# Patient Record
Sex: Female | Born: 1975 | Race: White | Hispanic: Yes | Marital: Married | Smoking: Never smoker
Health system: Southern US, Community
[De-identification: ages and names within clinical notes are randomized; demographics above are authoritative.]

## PROBLEM LIST (undated history)

## (undated) DIAGNOSIS — I10 Essential (primary) hypertension: Secondary | ICD-10-CM

## (undated) DIAGNOSIS — R87629 Unspecified abnormal cytological findings in specimens from vagina: Secondary | ICD-10-CM

## (undated) DIAGNOSIS — E079 Disorder of thyroid, unspecified: Secondary | ICD-10-CM

## (undated) HISTORY — DX: Unspecified abnormal cytological findings in specimens from vagina: R87.629

## (undated) HISTORY — PX: HERNIA REPAIR: SHX51

## (undated) HISTORY — DX: Disorder of thyroid, unspecified: E07.9

---

## 1999-12-03 ENCOUNTER — Inpatient Hospital Stay (HOSPITAL_COMMUNITY): Admission: EM | Admit: 1999-12-03 | Discharge: 1999-12-03 | Payer: Self-pay | Admitting: *Deleted

## 2001-12-21 ENCOUNTER — Ambulatory Visit (HOSPITAL_COMMUNITY): Admission: RE | Admit: 2001-12-21 | Discharge: 2001-12-21 | Payer: Self-pay | Admitting: *Deleted

## 2002-04-26 ENCOUNTER — Ambulatory Visit (HOSPITAL_COMMUNITY): Admission: RE | Admit: 2002-04-26 | Discharge: 2002-04-26 | Payer: Self-pay | Admitting: *Deleted

## 2002-04-26 ENCOUNTER — Encounter: Payer: Self-pay | Admitting: *Deleted

## 2002-05-07 ENCOUNTER — Inpatient Hospital Stay (HOSPITAL_COMMUNITY): Admission: AD | Admit: 2002-05-07 | Discharge: 2002-05-07 | Payer: Self-pay | Admitting: *Deleted

## 2002-05-21 ENCOUNTER — Inpatient Hospital Stay (HOSPITAL_COMMUNITY): Admission: AD | Admit: 2002-05-21 | Discharge: 2002-05-21 | Payer: Self-pay | Admitting: *Deleted

## 2002-05-22 ENCOUNTER — Inpatient Hospital Stay (HOSPITAL_COMMUNITY): Admission: AD | Admit: 2002-05-22 | Discharge: 2002-05-22 | Payer: Self-pay | Admitting: *Deleted

## 2002-05-23 ENCOUNTER — Inpatient Hospital Stay (HOSPITAL_COMMUNITY): Admission: AD | Admit: 2002-05-23 | Discharge: 2002-05-26 | Payer: Self-pay | Admitting: *Deleted

## 2002-05-23 ENCOUNTER — Inpatient Hospital Stay (HOSPITAL_COMMUNITY): Admission: AD | Admit: 2002-05-23 | Discharge: 2002-05-23 | Payer: Self-pay | Admitting: *Deleted

## 2003-06-13 ENCOUNTER — Ambulatory Visit (HOSPITAL_COMMUNITY): Admission: RE | Admit: 2003-06-13 | Discharge: 2003-06-13 | Payer: Self-pay | Admitting: Family Medicine

## 2003-06-26 ENCOUNTER — Other Ambulatory Visit: Admission: RE | Admit: 2003-06-26 | Discharge: 2003-06-26 | Payer: Self-pay | Admitting: Obstetrics and Gynecology

## 2004-01-02 ENCOUNTER — Ambulatory Visit (HOSPITAL_COMMUNITY): Admission: RE | Admit: 2004-01-02 | Discharge: 2004-01-02 | Payer: Self-pay | Admitting: *Deleted

## 2004-04-01 ENCOUNTER — Inpatient Hospital Stay (HOSPITAL_COMMUNITY): Admission: AD | Admit: 2004-04-01 | Discharge: 2004-04-02 | Payer: Self-pay | Admitting: *Deleted

## 2004-04-01 ENCOUNTER — Ambulatory Visit: Payer: Self-pay | Admitting: Obstetrics & Gynecology

## 2005-06-17 ENCOUNTER — Other Ambulatory Visit: Admission: RE | Admit: 2005-06-17 | Discharge: 2005-06-17 | Payer: Self-pay | Admitting: Gynecology

## 2016-06-26 ENCOUNTER — Observation Stay (HOSPITAL_COMMUNITY)
Admission: EM | Admit: 2016-06-26 | Discharge: 2016-06-27 | Disposition: A | Discharge status: Home / Self Care | Payer: Self-pay | Attending: Emergency Medicine | Admitting: Emergency Medicine

## 2016-06-26 ENCOUNTER — Emergency Department (HOSPITAL_COMMUNITY): Payer: Self-pay

## 2016-06-26 ENCOUNTER — Encounter (HOSPITAL_COMMUNITY): Payer: Self-pay

## 2016-06-26 DIAGNOSIS — R079 Chest pain, unspecified: Secondary | ICD-10-CM | POA: Insufficient documentation

## 2016-06-26 DIAGNOSIS — R739 Hyperglycemia, unspecified: Secondary | ICD-10-CM | POA: Insufficient documentation

## 2016-06-26 DIAGNOSIS — I160 Hypertensive urgency: Principal | ICD-10-CM | POA: Insufficient documentation

## 2016-06-26 DIAGNOSIS — I10 Essential (primary) hypertension: Secondary | ICD-10-CM | POA: Insufficient documentation

## 2016-06-26 HISTORY — DX: Essential (primary) hypertension: I10

## 2016-06-26 MED ORDER — SODIUM CHLORIDE 0.9 % IV SOLN
Freq: Once | INTRAVENOUS | Status: AC
  Administered 2016-06-27: 01:00:00 via INTRAVENOUS

## 2016-06-26 MED ORDER — AMLODIPINE BESYLATE 5 MG PO TABS
5.0000 mg | ORAL_TABLET | Freq: Once | ORAL | Status: AC
  Administered 2016-06-27: 5 mg via ORAL
  Filled 2016-06-26: qty 1

## 2016-06-26 NOTE — ED Provider Notes (Addendum)
[facility]-EMER[facility] DEPT Provider Note   CSN: [id number] Arrival date & time: 06/26/16  2224     History   Chief Complaint Chief Complaint  Patient presents with  . Hypertension    HPI Ty HiltsMonica Mathey is a 40 y.o. female.  As a 40 year old female with a history of hypertension.  He has not taken medication in approximately 3 years.  She currently has an IUD in place.  So has no regular menstrual cycles. Tonight, about 9:30 she started developing a headache, some chest discomfort, chest tightness radiating to her left arm.  Denies shortness of breath, nausea or diaphoresis.  Her husband took her blood pressure.  It was over 200.  He ever to Tylenol for her headache and brought her to the emergency department for evaluation.      Past Medical History:  Diagnosis Date  . Hypertension     Patient Active Problem List   Diagnosis Date Noted  . Chest pain 06/27/2016    History reviewed. No pertinent surgical history.  OB History    No data available       Home Medications    Prior to Admission medications   Medication Sig Start Date End Date Taking? Authorizing Provider  acetaminophen (TYLENOL) 500 MG tablet Take 500-1,000 mg by mouth every 6 (six) hours as needed for headache.   Yes Historical Provider, MD  Artificial Tear Ointment (DRY EYES OP) Place 1-2 drops into both eyes 3 (three) times daily as needed (for dry eyes).   Yes Historical Provider, MD  levonorgestrel (MIRENA) 20 MCG/24HR IUD 1 each by Intrauterine route once.   Yes Historical Provider, MD  naproxen sodium (ANAPROX) 220 MG tablet Take 220-440 mg by mouth 2 (two) times daily as needed (for pain).   Yes Historical Provider, MD    Family History History reviewed. No pertinent family history.  Social History Social History  Substance Use Topics  . Smoking status: Never Smoker  . Smokeless tobacco: Never Used  . Alcohol use No     Allergies   Patient has no known allergies.   Review of  Systems Review of Systems  Constitutional: Negative for chills and fever.  HENT: Negative for congestion.   Eyes: Negative for visual disturbance.  Respiratory: Negative for shortness of breath.   Cardiovascular: Positive for chest pain.  Gastrointestinal: Negative for nausea.  Genitourinary: Negative for dysuria.  Neurological: Positive for headaches. Negative for dizziness.  All other systems reviewed and are negative.    Physical Exam Updated Vital Signs BP 138/79   Pulse 77   Temp 99.3 F (37.4 C) (Oral)   Resp 17   Wt 72.1 kg   SpO2 99%   Physical Exam  Constitutional: She is oriented to person, place, and time. She appears well-developed and well-nourished.  Eyes: EOM are normal. Pupils are equal, round, and reactive to light.  Neck: Normal range of motion.  Cardiovascular: Normal rate and normal heart sounds.   Pulmonary/Chest: Effort normal and breath sounds normal.  Abdominal: Soft. Bowel sounds are normal.  Musculoskeletal: Normal range of motion. She exhibits no edema.  Neurological: She is alert and oriented to person, place, and time.  Skin: Skin is warm and dry.  Psychiatric: She has a normal mood and affect.  Nursing note and vitals reviewed.    ED Treatments / Results  Labs (all labs ordered are listed, but only abnormal results are displayed) Labs Reviewed  CBC WITH DIFFERENTIAL/PLATELET - Abnormal; Notable for the following:  Result Value   WBC 10.8 (*)    Neutro Abs 9.4 (*)    All other components within normal limits  URINALYSIS, ROUTINE W REFLEX MICROSCOPIC - Abnormal; Notable for the following:    Hgb urine dipstick SMALL (*)    Bacteria, UA RARE (*)    All other components within normal limits  I-STAT CHEM 8, ED - Abnormal; Notable for the following:    Glucose, Bld 169 (*)    Calcium, Ion 1.09 (*)    All other components within normal limits  PREGNANCY, URINE  I-STAT TROPOININ, ED    EKG  EKG  Interpretation  Date/Time:  Monday June 27 2016 00:22:22 EST Ventricular Rate:  74 PR Interval:    QRS Duration: 99 QT Interval:  394 QTC Calculation: 438 R Axis:   -168 Text Interpretation:  Sinus rhythm S1,S2,S3 pattern No old tracing to compare Confirmed by WARD,  DO, KRISTEN ([id number]) on 06/27/2016 1:12:05 AM       Radiology Ct Head Wo Contrast  Result Date: 06/27/2016 CLINICAL DATA:  40 year old female with hypertension and headache. EXAM: CT HEAD WITHOUT CONTRAST TECHNIQUE: Contiguous axial images were obtained from the base of the skull through the vertex without intravenous contrast. COMPARISON:  None. FINDINGS: Brain: No evidence of acute infarction, hemorrhage, hydrocephalus, extra-axial collection or mass lesion/mass effect. Vascular: No hyperdense vessel or unexpected calcification. Skull: Normal. Negative for fracture or focal lesion. Sinuses/Orbits: No acute finding. Other: None. IMPRESSION: No acute intracranial pathology. Electronically Signed   By: Elgie CollardArash  Radparvar M.D.   On: 06/27/2016 01:04    Procedures Procedures (including critical care time)  Medications Ordered in ED Medications  0.9 %  sodium chloride infusion ( Intravenous Stopped 06/27/16 0119)  amLODipine (NORVASC) tablet 5 mg (5 mg Oral Given 06/27/16 0032)     Initial Impression / Assessment and Plan / ED Course  I have reviewed the triage vital signs and the nursing notes.  Pertinent labs & imaging results that were available during my care of the patient were reviewed by me and considered in my medical decision making (see chart for details).  Clinical Course      Obtain basic screening labs, plus troponin due to patient's chest pain.  EKG, head CT.  She'll be given Norvasc and reevaluated Actions.  Blood pressure is normalizing with 5 mg, Norvasc, but of concern is her chest discomfort, elevated glucose level.  I feel this patient would benefit from an overnight observation to rule out  cardiac pathology.  A repeat fasting glucose in the morning. She refuses admission, stating she will follow-up outpatient.  I discussed this at length with her husband and still insisting on leaving AMA Final Clinical Impressions(s) / ED Diagnoses   Final diagnoses:  Hypertensive urgency  Chest pain, unspecified type  Hyperglycemia    New Prescriptions New Prescriptions   No medications on file     Earley FavorGail Beula Joyner, NP 06/26/16 2342    Vanetta MuldersScott Zackowski, MD 06/26/16 2354    Earley FavorGail Alayiah Fontes, NP 06/27/16 0133    Earley FavorGail Jaaziah Schulke, NP 06/27/16 0150    Earley FavorGail Chasta Deshpande, NP 06/27/16 0225    Earley FavorGail Jahmarion Popoff, NP 06/27/16 [id number]    Vanetta MuldersScott Zackowski, MD 06/30/16 1448

## 2016-06-26 NOTE — ED Triage Notes (Signed)
Pt complaining hypertension and headache. Pt states hx of htn, not currently on medication. Pt states home BP = 210/125, BP at triage = 181/97. Pt denies any lightheadedness or dizziness.

## 2016-06-27 DIAGNOSIS — R079 Chest pain, unspecified: Secondary | ICD-10-CM | POA: Diagnosis present

## 2016-06-27 LAB — I-STAT CHEM 8, ED
BUN: 15 mg/dL (ref 6–20)
CALCIUM ION: 1.09 mmol/L — AB (ref 1.15–1.40)
Chloride: 102 mmol/L (ref 101–111)
Creatinine, Ser: 0.5 mg/dL (ref 0.44–1.00)
Glucose, Bld: 169 mg/dL — ABNORMAL HIGH (ref 65–99)
HEMATOCRIT: 39 % (ref 36.0–46.0)
Hemoglobin: 13.3 g/dL (ref 12.0–15.0)
Potassium: 3.7 mmol/L (ref 3.5–5.1)
SODIUM: 139 mmol/L (ref 135–145)
TCO2: 22 mmol/L (ref 0–100)

## 2016-06-27 LAB — URINALYSIS, ROUTINE W REFLEX MICROSCOPIC
Bilirubin Urine: NEGATIVE
GLUCOSE, UA: NEGATIVE mg/dL
KETONES UR: NEGATIVE mg/dL
Leukocytes, UA: NEGATIVE
Nitrite: NEGATIVE
Protein, ur: NEGATIVE mg/dL
SPECIFIC GRAVITY, URINE: 1.005 (ref 1.005–1.030)
SQUAMOUS EPITHELIAL / LPF: NONE SEEN
pH: 6 (ref 5.0–8.0)

## 2016-06-27 LAB — CBC WITH DIFFERENTIAL/PLATELET
BASOS PCT: 0 %
Basophils Absolute: 0 10*3/uL (ref 0.0–0.1)
EOS ABS: 0.1 10*3/uL (ref 0.0–0.7)
EOS PCT: 1 %
HCT: 39 % (ref 36.0–46.0)
HEMOGLOBIN: 13 g/dL (ref 12.0–15.0)
Lymphocytes Relative: 8 %
Lymphs Abs: 0.9 10*3/uL (ref 0.7–4.0)
MCH: 27.5 pg (ref 26.0–34.0)
MCHC: 33.3 g/dL (ref 30.0–36.0)
MCV: 82.5 fL (ref 78.0–100.0)
Monocytes Absolute: 0.5 10*3/uL (ref 0.1–1.0)
Monocytes Relative: 5 %
NEUTROS PCT: 86 %
Neutro Abs: 9.4 10*3/uL — ABNORMAL HIGH (ref 1.7–7.7)
PLATELETS: 257 10*3/uL (ref 150–400)
RBC: 4.73 MIL/uL (ref 3.87–5.11)
RDW: 13.2 % (ref 11.5–15.5)
WBC: 10.8 10*3/uL — AB (ref 4.0–10.5)

## 2016-06-27 LAB — PREGNANCY, URINE: Preg Test, Ur: NEGATIVE

## 2016-06-27 LAB — I-STAT TROPONIN, ED: TROPONIN I, POC: 0.01 ng/mL (ref 0.00–0.08)

## 2016-06-27 MED ORDER — AMLODIPINE BESYLATE 5 MG PO TABS: 5.0000 mg | ORAL_TABLET | Freq: Every day | ORAL | 0 refills | Status: AC

## 2016-06-27 NOTE — Discharge Instructions (Signed)
I wished she would've stayed in the hospital for further evaluation.  Please return anytime you develop chest pain, shortness of breath, nausea, sweating. I also recommend that you establish with a primary care physician as soon as possible to have your sugar rechecked as today was quite elevated You have been given a prescription for Norvasc, which is the medication.  We given in the emergency department for your blood pressure.  Please take this on a daily basis

## 2016-06-27 NOTE — ED Notes (Signed)
Nurse starting and getting labs

## 2017-05-03 ENCOUNTER — Other Ambulatory Visit: Payer: Self-pay | Admitting: Obstetrics & Gynecology

## 2017-05-03 DIAGNOSIS — Z1231 Encounter for screening mammogram for malignant neoplasm of breast: Secondary | ICD-10-CM

## 2017-05-23 ENCOUNTER — Ambulatory Visit
Admission: RE | Admit: 2017-05-23 | Discharge: 2017-05-23 | Disposition: A | Discharge status: Home / Self Care | Payer: No Typology Code available for payment source | Source: Ambulatory Visit | Attending: Obstetrics & Gynecology | Admitting: Obstetrics & Gynecology

## 2017-05-23 DIAGNOSIS — Z1231 Encounter for screening mammogram for malignant neoplasm of breast: Secondary | ICD-10-CM

## 2019-12-20 ENCOUNTER — Other Ambulatory Visit: Payer: Self-pay | Admitting: *Deleted

## 2019-12-20 DIAGNOSIS — Z1231 Encounter for screening mammogram for malignant neoplasm of breast: Secondary | ICD-10-CM

## 2020-01-14 ENCOUNTER — Ambulatory Visit: Payer: No Typology Code available for payment source

## 2020-01-16 ENCOUNTER — Other Ambulatory Visit: Payer: Self-pay

## 2020-01-16 ENCOUNTER — Ambulatory Visit: Payer: Self-pay | Admitting: *Deleted

## 2020-01-16 ENCOUNTER — Ambulatory Visit
Admission: RE | Admit: 2020-01-16 | Discharge: 2020-01-16 | Disposition: A | Discharge status: Home / Self Care | Payer: No Typology Code available for payment source | Source: Ambulatory Visit | Attending: Obstetrics and Gynecology | Admitting: Obstetrics and Gynecology

## 2020-01-16 VITALS — BP 143/84 | Temp 98.4°F | Wt 153.5 lb

## 2020-01-16 DIAGNOSIS — Z1239 Encounter for other screening for malignant neoplasm of breast: Secondary | ICD-10-CM

## 2020-01-16 DIAGNOSIS — R87612 Low grade squamous intraepithelial lesion on cytologic smear of cervix (LGSIL): Secondary | ICD-10-CM

## 2020-01-16 DIAGNOSIS — Z1231 Encounter for screening mammogram for malignant neoplasm of breast: Secondary | ICD-10-CM

## 2020-01-16 NOTE — Progress Notes (Addendum)
[name] is a 44 y.o. female who presents to [facility] clinic today with complaint of lump on left nipple x 1.5 months. Patient initially referred to BCCCP by the [facility] Department due to having an abnormal Pap smear on 12/04/2019 that a colposcopy is recommended for follow-up.   Pap Smear: Pap not smear completed today. Last Pap smear was 12/04/2019 at the [facility] Department clinic and was LSIL with positive HPV. Per patient has no history of an abnormal Pap smear prior to her most recent Pap smear. Last Pap smear result is available in Epic.   Physical exam: Breasts Breasts symmetrical. No skin abnormalities bilateral breasts. Bilateral nipple retraction that is greater within the left nipple that per patient is normal for her. No nipple discharge bilateral breasts. No lymphadenopathy. No lumps palpated bilateral breasts. No lump palpated in patients area of concern. Area on left nipple appears slightly swollen with no redness or skin changes. Patient denies any injuries to her breast. No complaints of pain or tenderness on exam.       Pelvic/Bimanual Pap is not indicated today per BCCCP guidelines.    Smoking History: Patient has never smoked.   Patient Navigation: Patient education provided. Access to services provided for patient through [place] program. Spanish interpreter Maretta Los from [facility] provided.   Breast and Cervical Cancer Risk Assessment: Patient has family history of her father having breast cancer. Patient has no known genetic mutations or history of radiation treatment to the chest before age 53. Patient does not have history of cervical dysplasia, immunocompromised, or DES exposure in-utero.  Risk Assessment    Risk Scores      01/16/2020   Last edited by: Narda Rutherford, LPN   5-year risk: 1 %   Lifetime risk: 13.1 %          A: BCCCP exam without pap smear  P: Referred patient to the [facility] Center of [facility] for a  screening mammogram on the mobile unit. Appointment scheduled Thursday, January 16, 2020 at 0900.  Referred patient to the [facility] for a colposcopy to follow-up for her abnormal Pap smear. Appointment scheduled for Friday, January 24, 2020 at 0835.  Priscille Heidelberg, RN 01/16/2020 8:40 AM

## 2020-01-16 NOTE — Patient Instructions (Signed)
Explained breast self awareness with [name]. Patient did not need a Pap smear today due to last Pap smear was 12/04/2019. Explained the colposcopy the recommended follow-up for her abnormal Pap smear. Referred patient to the [facility] for [facility] for a colposcopy to follow-up for her abnormal Pap smear. Appointment scheduled for Friday, January 24, 2020 at 0835. Referred patient to the [facility] Center of [facility] for a screening mammogram on the mobile unit. Appointment scheduled Thursday, January 16, 2020 at 0900. Patient aware of appointments and will be there. Let patient know the Breast Center will follow up with her within the next couple weeks with results of her mammogram by letter or phone. Dawn Nguyen verbalized understanding.  Avianna Moynahan, [name], RN 8:40 AM

## 2020-01-24 ENCOUNTER — Encounter: Payer: Self-pay | Admitting: Obstetrics and Gynecology

## 2020-01-24 ENCOUNTER — Ambulatory Visit (INDEPENDENT_AMBULATORY_CARE_PROVIDER_SITE_OTHER): Payer: Self-pay | Admitting: Obstetrics and Gynecology

## 2020-01-24 ENCOUNTER — Other Ambulatory Visit: Payer: Self-pay

## 2020-01-24 ENCOUNTER — Other Ambulatory Visit (HOSPITAL_COMMUNITY)
Admission: RE | Admit: 2020-01-24 | Discharge: 2020-01-24 | Disposition: A | Discharge status: Home / Self Care | Payer: No Typology Code available for payment source | Source: Ambulatory Visit | Attending: Obstetrics and Gynecology | Admitting: Obstetrics and Gynecology

## 2020-01-24 VITALS — BP 146/98 | HR 90 | Ht 62.0 in | Wt 152.9 lb

## 2020-01-24 DIAGNOSIS — N879 Dysplasia of cervix uteri, unspecified: Secondary | ICD-10-CM | POA: Insufficient documentation

## 2020-01-24 DIAGNOSIS — N871 Moderate cervical dysplasia: Secondary | ICD-10-CM

## 2020-01-24 DIAGNOSIS — Z3202 Encounter for pregnancy test, result negative: Secondary | ICD-10-CM

## 2020-01-24 HISTORY — PX: COLPOSCOPY W/ BIOPSY / CURETTAGE: SUR283

## 2020-01-24 NOTE — Procedures (Signed)
Colposcopy Procedure Note  Pre-operative Diagnosis: 11/2019 pap: LSIL, HPV+. Patient states she's never had an abnormal pap before. Last pap unknown  Post-operative Diagnosis: CIN 1  Procedure Details  UPT negative. patient also denies any tobacco use or 2nd hand smoke exposure.   The risks (including infection, bleeding, pain) and benefits of the procedure were explained to the patient and written informed consent was obtained.  The patient was placed in the dorsal lithotomy position. A Graves was speculum inserted in the vagina, and the cervix was visualized.  AA staining done Lugol's with green filter.  Biopsy from 12 and 6 o'clock and then single toothed tenaculum applied and ECC in all four quadrants done. No bleeding after procedure with silver nitrate  Findings: mild AWE changes at 12 and 6 o'clock  Adequate: no  Specimens: 12 and 6 o'clock cervical (sent together), endocervical curettage  Condition: Stable  Complications: None  Plan: The patient was advised to call for any fever or for prolonged or severe pain or bleeding. She was advised to use OTC analgesics as needed for mild to moderate pain. She was advised to do pelvic rest for one week.    Cornelia Copa MD Attending Center for [employer] [facility])

## 2020-01-27 LAB — POCT PREGNANCY, URINE: Preg Test, Ur: NEGATIVE

## 2020-01-28 LAB — SURGICAL PATHOLOGY

## 2020-02-03 ENCOUNTER — Telehealth: Payer: Self-pay | Admitting: Lactation Services

## 2020-02-03 NOTE — Telephone Encounter (Signed)
Called patient with assistance of [employer] [place], [place] # [id number].   Attempted to call patient twice. She did not answer. LM for patient to call the office for results. Call back number given.

## 2020-02-03 NOTE — Telephone Encounter (Signed)
-----   Message from Mission Bing, MD sent at 01/30/2020  9:07 AM EDT ----- [facility] clinic, can you let her know that her colpo had some slightly abnormal cells and to just repeat her pap smear and HPV testing in one year? Thanks!

## 2020-02-17 ENCOUNTER — Telehealth: Payer: Self-pay

## 2020-02-17 NOTE — Telephone Encounter (Signed)
Spoke with patient via interpreter Natale Lay. Informed patient that her colpo showed mild dysplasia with some slightly abnormal cells. Based on this result Dr. Vergie Living recommended that she have a repeat pap smear and co-testing in 1 year. Patient voiced understanding.

## 2020-12-22 ENCOUNTER — Other Ambulatory Visit: Payer: Self-pay | Admitting: Obstetrics and Gynecology

## 2020-12-22 ENCOUNTER — Other Ambulatory Visit: Payer: Self-pay | Admitting: *Deleted

## 2020-12-22 DIAGNOSIS — Z1231 Encounter for screening mammogram for malignant neoplasm of breast: Secondary | ICD-10-CM

## 2021-02-04 ENCOUNTER — Other Ambulatory Visit: Payer: Self-pay

## 2021-02-04 ENCOUNTER — Ambulatory Visit: Payer: Self-pay | Admitting: *Deleted

## 2021-02-04 ENCOUNTER — Ambulatory Visit
Admission: RE | Admit: 2021-02-04 | Discharge: 2021-02-04 | Disposition: A | Discharge status: Home / Self Care | Payer: No Typology Code available for payment source | Source: Ambulatory Visit | Attending: Obstetrics and Gynecology | Admitting: Obstetrics and Gynecology

## 2021-02-04 VITALS — BP 100/70 | Wt 145.3 lb

## 2021-02-04 DIAGNOSIS — Z1239 Encounter for other screening for malignant neoplasm of breast: Secondary | ICD-10-CM

## 2021-02-04 DIAGNOSIS — Z1231 Encounter for screening mammogram for malignant neoplasm of breast: Secondary | ICD-10-CM

## 2021-02-04 NOTE — Patient Instructions (Signed)
Explained breast self awareness with [name]. Patient did not need a Pap smear today due to last Pap smear was 01/28/2021 per patient. Patient has not received results for Pap smear. Let patient know that follow-up for Pap smear will be based on the result of today's Pap smear. Referred patient to the [facility] Center of [facility] for a screening mammogram on mobile unit. Appointment scheduled Thursday, February 04, 2021 at 1500. Patient escorted to the mobile unit following BCCCP appointment for her screening mammogram. Let patient know the Breast Center will follow up with her within the next couple weeks with results of her mammogram by letter or phone. Dawn Nguyen verbalized understanding.  [name], [name], RN 2:57 PM

## 2021-02-04 NOTE — Progress Notes (Signed)
Dawn Nguyen is a 45 y.o. female who presents to [facility] clinic today with no complaints.    Pap Smear: Pap smear not completed today. Last Pap smear was 01/28/2021 at the [facility] clinic and patient has not received her results. Patient has history of an abnormal Pap smear 12/04/2019 that was LSIL with positive HPV that a colposcopy was completed for follow-up that showed CIN-I. Last Pap smear result is available in Epic.   Physical exam: Breasts Breasts symmetrical. No skin abnormalities bilateral breasts. No nipple retraction bilateral breasts. No nipple discharge bilateral breasts. No lymphadenopathy. No lumps palpated bilateral breasts. No complaints of pain or tenderness on exam.  MS DIGITAL SCREENING BILATERAL  Result Date: 05/23/2017 CLINICAL DATA:  Screening. EXAM: DIGITAL SCREENING BILATERAL MAMMOGRAM WITH CAD COMPARISON:  None. ACR Breast Density Category c: The breast tissue is heterogeneously dense, which may obscure small masses FINDINGS: There are no findings suspicious for malignancy. Images were processed with CAD. IMPRESSION: No mammographic evidence of malignancy. A result letter of this screening mammogram will be mailed directly to the patient. RECOMMENDATION: Screening mammogram in one year. (Code:SM-B-01Y) BI-RADS CATEGORY  1: Negative. Electronically Signed   By: Harmon Pier M.D.   On: 05/23/2017 16:05   MS DIGITAL SCREENING TOMO BILATERAL  Result Date: 01/21/2020 CLINICAL DATA:  Screening. EXAM: DIGITAL SCREENING BILATERAL MAMMOGRAM WITH TOMO AND CAD COMPARISON:  Previous exam(s). ACR Breast Density Category c: The breast tissue is heterogeneously dense, which may obscure small masses. FINDINGS: There are no findings suspicious for malignancy. Images were processed with CAD. IMPRESSION: No mammographic evidence of malignancy. A result letter of this screening mammogram will be mailed directly to the patient. RECOMMENDATION: Screening mammogram in one  year. (Code:SM-B-01Y) BI-RADS CATEGORY  1: Negative. Electronically Signed   By: Bary Richard M.D.   On: 01/21/2020 10:03        Pelvic/Bimanual Pap is not indicated today per BCCCP guidelines.   Smoking History: Patient has never smoked.   Patient Navigation: Patient education provided. Access to services provided for patient through [place] program. Spanish interpreter Natale Lay from [facility] provided.   Colorectal Cancer Screening: Per patient has never had colonoscopy completed. No complaints today.    Breast and Cervical Cancer Risk Assessment: Patient has family history of her father having breast cancer. Patient has no known genetic mutations or history of radiation treatment to the chest before age 82. Patient has history of cervical dysplasia. Patient has no history of being immunocompromised or DES exposure in-utero.  Risk Assessment     Risk Scores       02/04/2021 01/16/2020   Last edited by: Narda Rutherford, LPN McG[place], Sherie Demetrius Charity, LPN   5-year risk: 1.1 % 1 %   Lifetime risk: 13 % 13.1 %            A: BCCCP exam without pap smear No complaints.  P: Referred patient to the [facility] Center of [facility] for a screening mammogram on mobile unit. Appointment scheduled Thursday, February 04, 2021 at 1500.  Priscille Heidelberg, RN 02/04/2021 2:56 PM

## 2022-05-11 IMAGING — MG MM DIGITAL SCREENING BILAT W/ TOMO AND CAD
8 series · 9 of 24 positions shown · non-contrast
Comparison: Previous exam(s).

CLINICAL DATA: Screening.

EXAM:
DIGITAL SCREENING BILATERAL MAMMOGRAM WITH TOMOSYNTHESIS AND CAD
TECHNIQUE: Bilateral screening digital craniocaudal and mediolateral oblique
mammograms were obtained. Bilateral screening digital breast
tomosynthesis was performed. The images were evaluated with
computer-aided detection.

[R CC synth-2D]
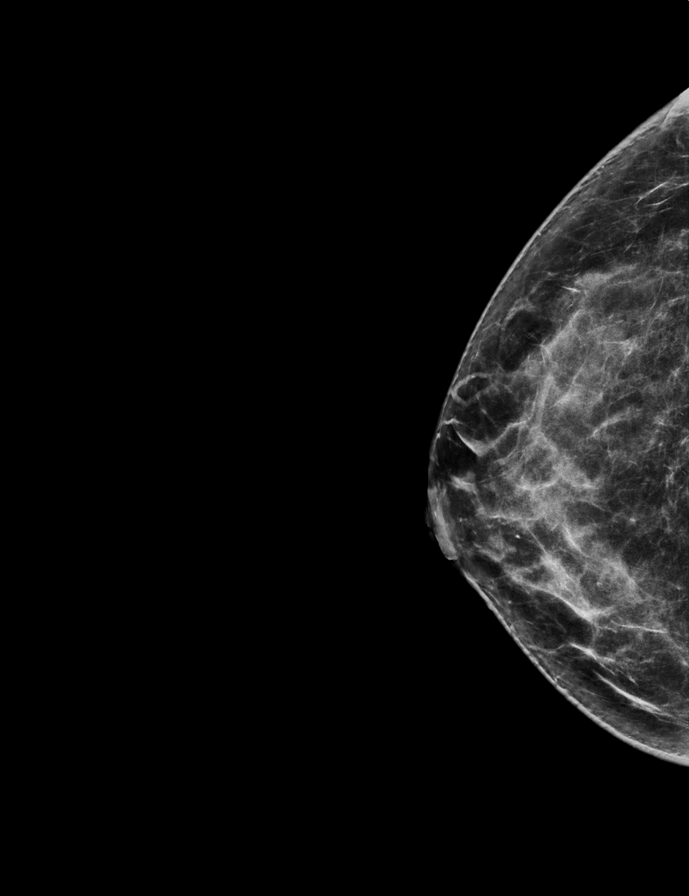

[R MLO synth-2D]
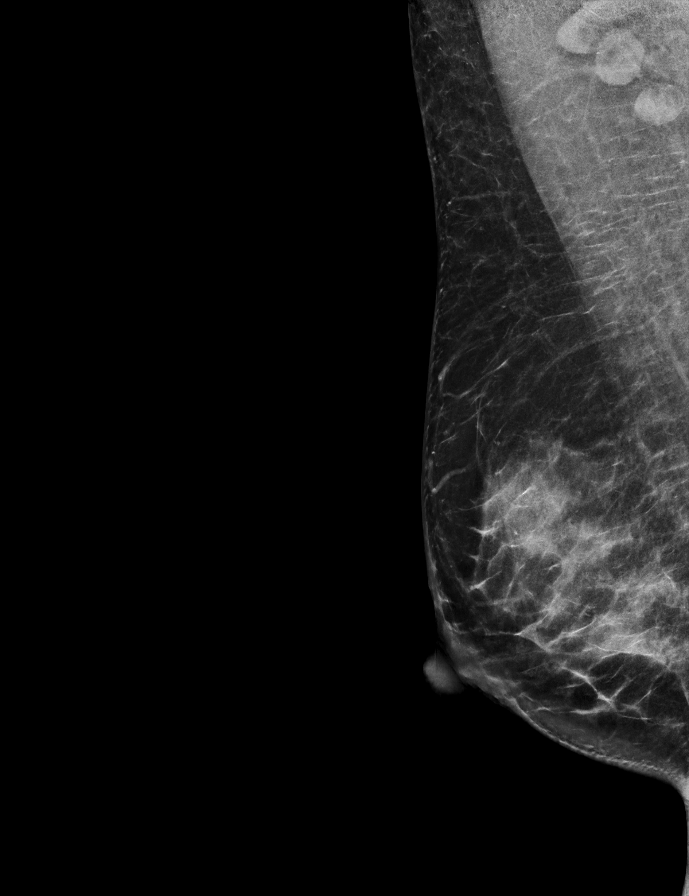

[L MLO synth-2D]
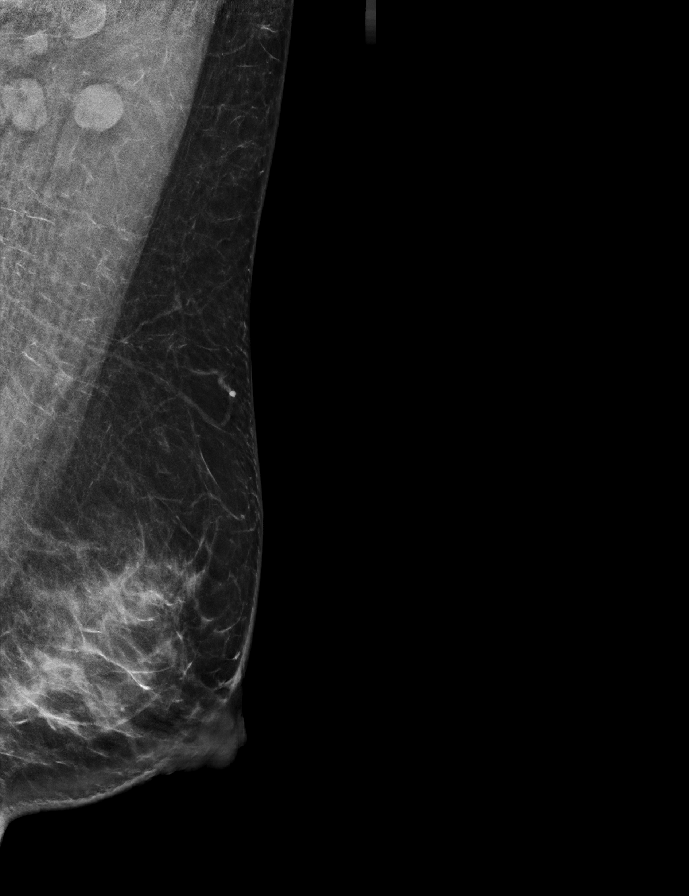

[L CC synth-2D]
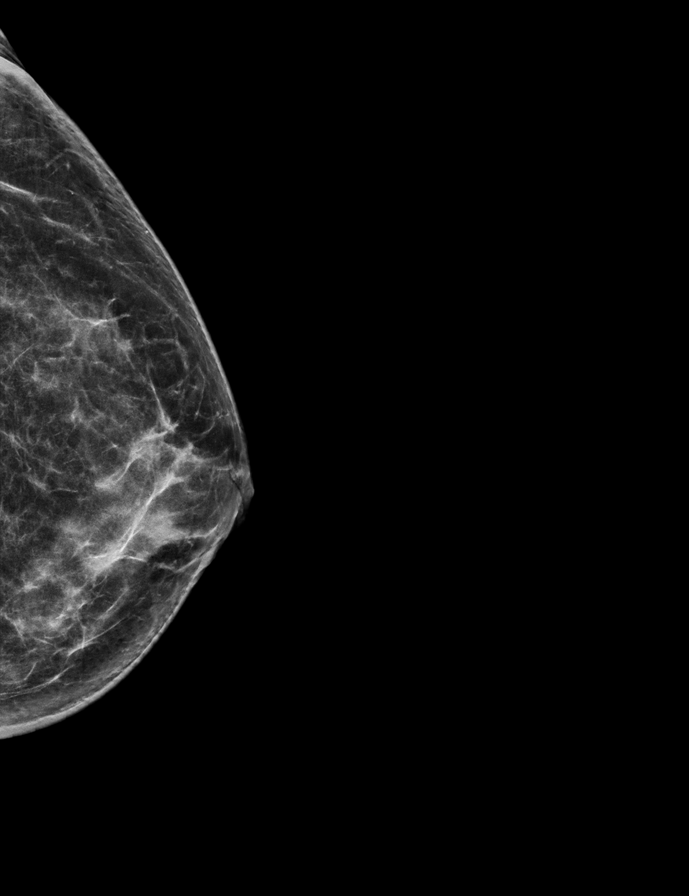

[R MLO tomo · 2 of 64 frames shown]
[frame 21/64]
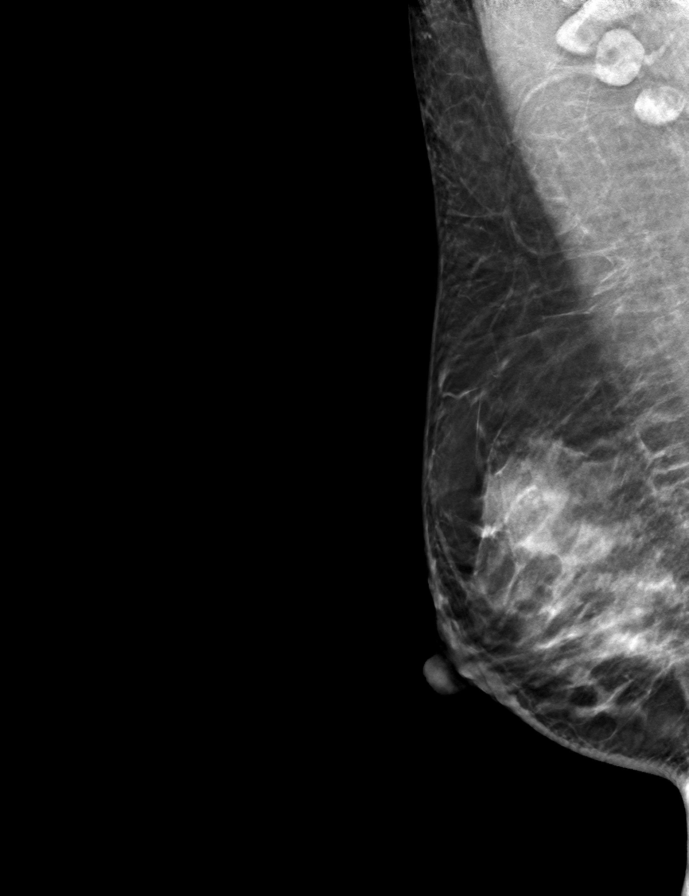
[frame 33/64]
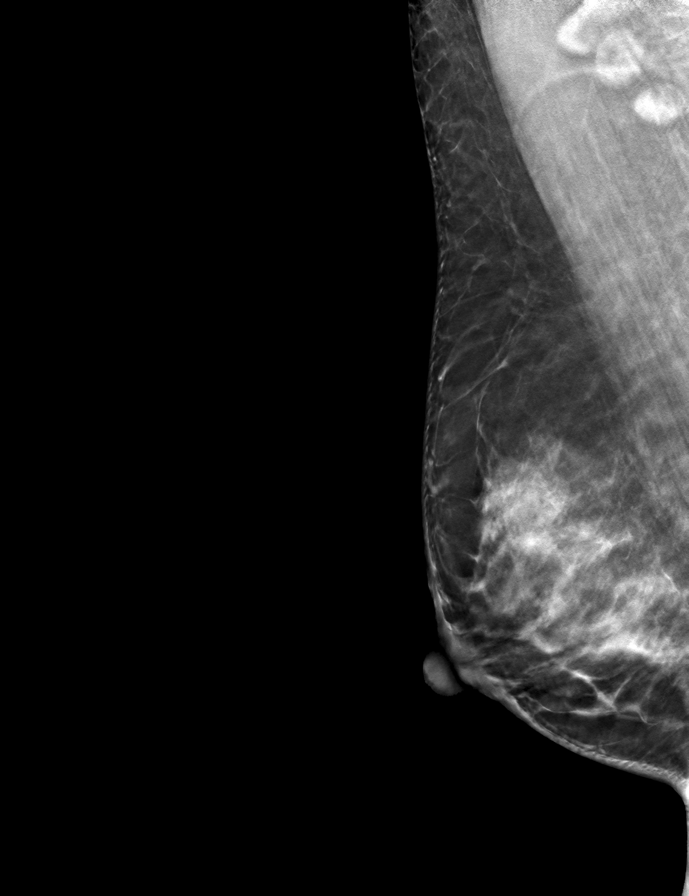

[L CC tomo · tomo slice 31/60.0]
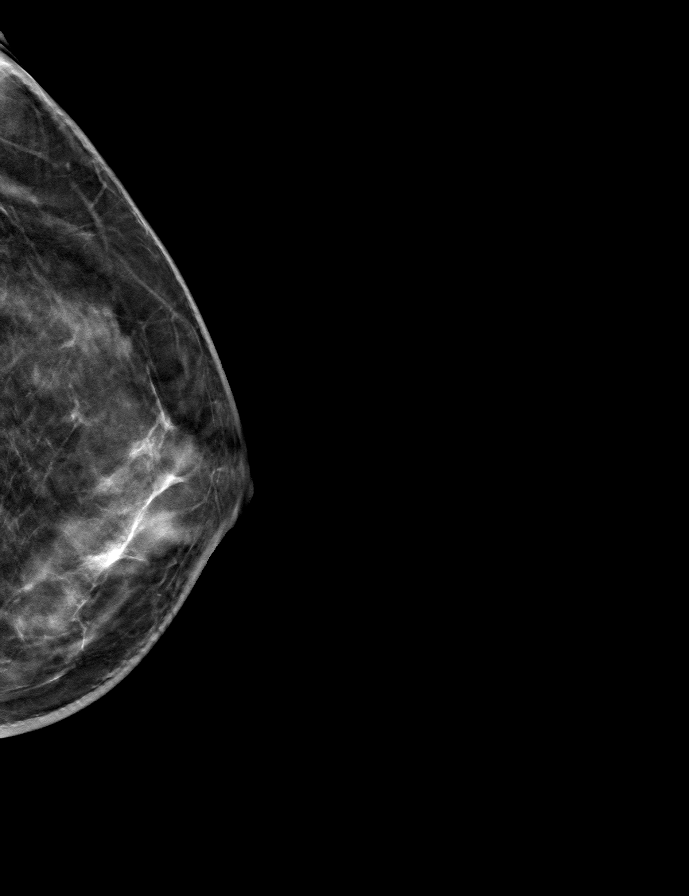

[L MLO tomo · tomo slice 35/69.0]
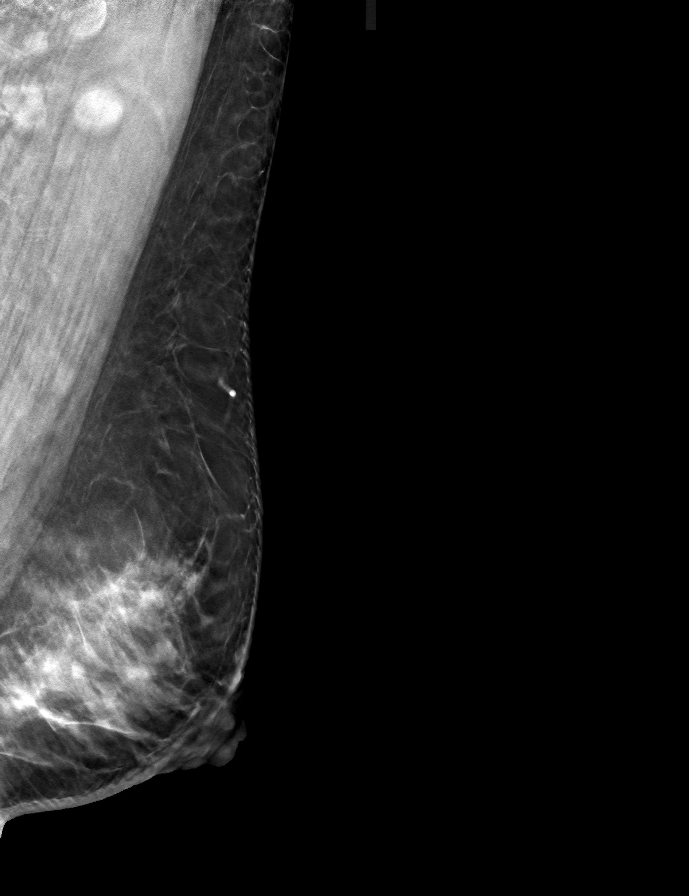

[R CC tomo · tomo slice 31/60.0]
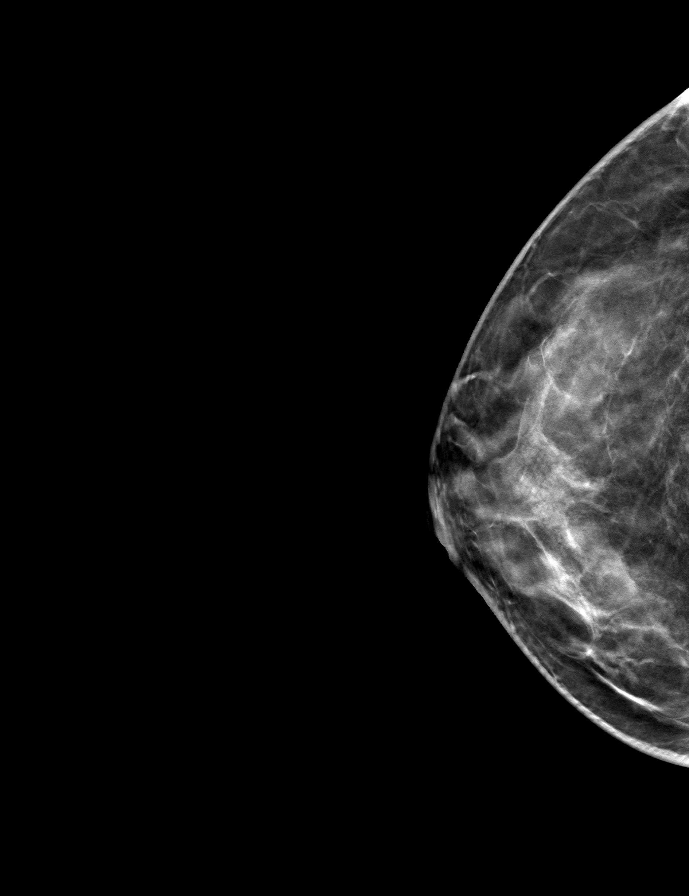

[9 of 24 positions shown; findings below may reference images not displayed]

ACR Breast Density Category c: The breast tissue is heterogeneously
dense, which may obscure small masses.
FINDINGS: There are no findings suspicious for malignancy.
IMPRESSION: No mammographic evidence of malignancy. A result letter of this
screening mammogram will be mailed directly to the patient.

RECOMMENDATION:
Screening mammogram in one year. (Code:Q3-W-BC3)

BI-RADS CATEGORY  1: Negative.

## 2023-01-23 ENCOUNTER — Other Ambulatory Visit: Payer: Self-pay | Admitting: Internal Medicine

## 2023-01-23 ENCOUNTER — Ambulatory Visit
Admission: RE | Admit: 2023-01-23 | Discharge: 2023-01-23 | Disposition: A | Discharge status: Home / Self Care | Payer: No Typology Code available for payment source | Source: Ambulatory Visit | Attending: Internal Medicine | Admitting: Internal Medicine

## 2023-01-23 DIAGNOSIS — M25562 Pain in left knee: Secondary | ICD-10-CM

## 2023-05-16 ENCOUNTER — Other Ambulatory Visit: Payer: Self-pay | Admitting: Obstetrics and Gynecology

## 2023-05-16 DIAGNOSIS — Z1231 Encounter for screening mammogram for malignant neoplasm of breast: Secondary | ICD-10-CM

## 2023-08-03 ENCOUNTER — Ambulatory Visit: Payer: Self-pay | Admitting: *Deleted

## 2023-08-03 ENCOUNTER — Ambulatory Visit
Admission: RE | Admit: 2023-08-03 | Discharge: 2023-08-03 | Disposition: A | Discharge status: Home / Self Care | Payer: No Typology Code available for payment source | Source: Ambulatory Visit | Attending: Obstetrics and Gynecology | Admitting: Obstetrics and Gynecology

## 2023-08-03 VITALS — BP 147/75 | Wt 157.0 lb

## 2023-08-03 DIAGNOSIS — Z01419 Encounter for gynecological examination (general) (routine) without abnormal findings: Secondary | ICD-10-CM

## 2023-08-03 DIAGNOSIS — Z1231 Encounter for screening mammogram for malignant neoplasm of breast: Secondary | ICD-10-CM

## 2023-08-03 NOTE — Progress Notes (Signed)
Dawn Nguyen is a 48 y.o. No obstetric history on file. female who presents to [facility] clinic today with no complaints.    Pap Smear: Pap smear completed today. Last Pap smear was 12/04/2019 at the [facility] Department clinic and was LSIL with positive HPV. Patient had a colposcopy to follow up 01/24/2020 that showed CIN-I. Per patient has no history of an abnormal Pap smear prior to her most recent Pap smear. Last Pap smear result is available in Epic.   Physical exam: Breasts Breasts symmetrical. No skin abnormalities bilateral breasts. No nipple retraction bilateral breasts. No nipple discharge bilateral breasts. No lymphadenopathy. No lumps palpated bilateral breasts. No complaints of pain or tenderness on exam.     MS DIGITAL SCREENING TOMO BILATERAL Result Date: 02/09/2021 CLINICAL DATA:  Screening. EXAM: DIGITAL SCREENING BILATERAL MAMMOGRAM WITH TOMOSYNTHESIS AND CAD TECHNIQUE: Bilateral screening digital craniocaudal and mediolateral oblique mammograms were obtained. Bilateral screening digital breast tomosynthesis was performed. The images were evaluated with computer-aided detection. COMPARISON:  Previous exam(s). ACR Breast Density Category c: The breast tissue is heterogeneously dense, which may obscure small masses. FINDINGS: There are no findings suspicious for malignancy. IMPRESSION: No mammographic evidence of malignancy. A result letter of this screening mammogram will be mailed directly to the patient. RECOMMENDATION: Screening mammogram in one year. (Code:SM-B-01Y) BI-RADS CATEGORY  1: Negative. Electronically Signed   By: Annia Belt M.D.   On: 02/09/2021 13:28   MS DIGITAL SCREENING TOMO BILATERAL Result Date: 01/21/2020 CLINICAL DATA:  Screening. EXAM: DIGITAL SCREENING BILATERAL MAMMOGRAM WITH TOMO AND CAD COMPARISON:  Previous exam(s). ACR Breast Density Category c: The breast tissue is heterogeneously dense, which may obscure small masses. FINDINGS: There are no  findings suspicious for malignancy. Images were processed with CAD. IMPRESSION: No mammographic evidence of malignancy. A result letter of this screening mammogram will be mailed directly to the patient. RECOMMENDATION: Screening mammogram in one year. (Code:SM-B-01Y) BI-RADS CATEGORY  1: Negative. Electronically Signed   By: Bary Richard M.D.   On: 01/21/2020 10:03   Pelvic/Bimanual Ext Genitalia No lesions, no swelling and no discharge observed on external genitalia.        Vagina Vagina pink and normal texture. No lesions or discharge observed in vagina.        Cervix Cervix is present. Cervix pink and of normal texture. No discharge observed.    Uterus Uterus is present and palpable. Uterus in normal position and normal size.        Adnexae Bilateral ovaries present and palpable. No tenderness on palpation.         Rectovaginal No rectal exam completed today since patient had no rectal complaints. No skin abnormalities observed on exam.     Smoking History: Patient has never smoked.   Patient Navigation: Patient education provided. Access to services provided for patient through [place] program. Spanish interpreter Natale Lay from [facility] provided.   Colorectal Cancer Screening: Per patient has never had colonoscopy completed. Patient completed a FIT test 05/11/2023 that was negative. No complaints today.    Breast and Cervical Cancer Risk Assessment: Patient has family history of her father having breast cancer. Patient has no known genetic mutations or history of radiation treatment to the chest before age 37. Patient has history of cervical dysplasia. Patient has no history of being immunocompromised or DES exposure in-utero.   Risk Scores as of Encounter on 08/03/2023     [facility]           5-year 0.77%  Lifetime 8.67%            Last calculated by Caprice Red, CMA on 08/03/2023 at  3:11 PM        A: BCCCP exam with pap smear No complaints.  P: Referred  patient to the [facility] of [facility] for a screening mammogram on mobile unit. Appointment scheduled Thursday, August 03, 2023 at 1610.  Priscille Heidelberg, RN 08/03/2023 4:30 PM

## 2023-08-03 NOTE — Patient Instructions (Signed)
Explained breast self awareness with [name]. Pap smear completed today. Let her know that if today's Pap smear is normal and HPV negative that her next Pap smear will be due in one year due to her history of an abnormal Pap smear. Referred patient to the [facility] of [facility] for a screening mammogram on mobile unit. Appointment scheduled Thursday, August 03, 2023 at 1610. Patient aware of appointment and will be there. Let patient know will follow up with her within the next couple weeks with results of her Pap smear by letter or phone. Informed patient that the Breast Center will follow up with her within the next couple of weeks with results of her mammogram by letter or phone. Dawn Nguyen verbalized understanding.  [name], [name], RN 4:30 PM

## 2023-08-08 ENCOUNTER — Telehealth: Payer: Self-pay

## 2023-08-08 LAB — CYTOLOGY - PAP
Comment: NEGATIVE
High risk HPV: NEGATIVE

## 2023-08-08 NOTE — Telephone Encounter (Signed)
Using [employer], [place] ID: [id number], I attempted to reach the pt. She did not answer, therefore I left her voicemail requesting a return call for results.

## 2023-08-17 NOTE — Telephone Encounter (Signed)
 Pt presented to the [facility] to review her results. I have provided the pt with her results and reviewed them with her. All questions were answered and she has been scheduled for a COLPO on 10/03/23 at 2:30pm.  Spanish Interpreter, Geni Angry assisted with this pt.

## 2023-10-03 ENCOUNTER — Ambulatory Visit: Payer: Self-pay | Admitting: Hematology and Oncology

## 2023-10-03 ENCOUNTER — Other Ambulatory Visit (HOSPITAL_COMMUNITY)
Admission: RE | Admit: 2023-10-03 | Discharge: 2023-10-03 | Disposition: A | Discharge status: Home / Self Care | Source: Ambulatory Visit | Attending: Obstetrics and Gynecology | Admitting: Obstetrics and Gynecology

## 2023-10-03 ENCOUNTER — Ambulatory Visit: Payer: No Typology Code available for payment source

## 2023-10-03 DIAGNOSIS — R87612 Low grade squamous intraepithelial lesion on cytologic smear of cervix (LGSIL): Secondary | ICD-10-CM | POA: Insufficient documentation

## 2023-10-03 DIAGNOSIS — Z01812 Encounter for preprocedural laboratory examination: Secondary | ICD-10-CM

## 2023-10-03 NOTE — Progress Notes (Signed)
 [facility]   GYNECOLOGY CLINIC COLPOSCOPY PROCEDURE NOTE  [name] is a 48 y.o. No obstetric history on file. here for colposcopy for low-grade squamous intraepithelial neoplasia (LGSIL - encompassing HPV,mild dysplasia,CIN I) pap smear on 08/03/2023. Discussed role for HPV in cervical dysplasia, need for surveillance.  Patient given informed consent, signed copy in the chart, time out was performed.  Placed in lithotomy position. Cervix viewed with speculum and colposcope after application of acetic acid.   Colposcopy adequate? Yes  acetowhite lesion(s) noted at 12 o'clock; biopsies obtained.  ECC specimen obtained. All specimens were labelled and sent to pathology.  Patient was given post procedure instructions.  Will follow up pathology and manage accordingly.  Routine preventative health maintenance measures emphasized.   Ilda Basset A, NP 10/03/2023 2:10 PM

## 2023-10-05 LAB — SURGICAL PATHOLOGY

## 2023-10-19 ENCOUNTER — Telehealth: Payer: Self-pay

## 2023-10-19 DIAGNOSIS — R87612 Low grade squamous intraepithelial lesion on cytologic smear of cervix (LGSIL): Secondary | ICD-10-CM

## 2023-10-19 NOTE — Telephone Encounter (Signed)
-----   Message from Pascal Lux sent at 10/12/2023  9:34 AM EDT ----- Please refer to gynecology for possible cryo. LSIL x 2 years on biopsy.

## 2023-10-19 NOTE — Telephone Encounter (Signed)
 Using [facility] interpreter, Joslyn Hy, I have advised the pt of her COLPO results. I have also advised the pt that a referral has been sent to [facility] for a consultation regarding the ongoing cervical abnormality (LSIL w/neg HPV). All questions were answered and pt voiced understanding of the information provided.

## 2023-11-23 ENCOUNTER — Ambulatory Visit (INDEPENDENT_AMBULATORY_CARE_PROVIDER_SITE_OTHER): Payer: Self-pay | Admitting: Obstetrics and Gynecology

## 2023-11-23 ENCOUNTER — Encounter: Payer: Self-pay | Admitting: Obstetrics and Gynecology

## 2023-11-23 ENCOUNTER — Other Ambulatory Visit: Payer: Self-pay

## 2023-11-23 VITALS — BP 144/83 | HR 71 | Ht 62.0 in | Wt 156.8 lb

## 2023-11-23 DIAGNOSIS — Z975 Presence of (intrauterine) contraceptive device: Secondary | ICD-10-CM

## 2023-11-23 DIAGNOSIS — N879 Dysplasia of cervix uteri, unspecified: Secondary | ICD-10-CM

## 2023-11-23 NOTE — Progress Notes (Unsigned)
 NEW GYNECOLOGY PATIENT Patient name: Dawn Nguyen MRN [id number]  Date of birth: [birth year] Chief Complaint:   Consult     History:  Dawn Nguyen is a 48 y.o. G2P2002 being seen today for discussion of colpo results.  Denies history of abnormal pap previously.   Irregular spotting with the IUD in place. No hot flashes or vaginal dryness. IUD has been in place for a few months. IUD inserted after nexplanon/implant removed. Would like IUD strings cut as her partner felt that he was poked by the strings      Gynecologic History Patient's last menstrual period was 11/11/2023 (approximate). Contraception: IUD Last Pap:  10/03/23 colpo CIN 1 08/03/23 pap LSIL, HPV negaitve 01/24/20 colpo CIN 1 12/04/19 pap LSIL, HPV negative   Last Mammogram: 07/2023 BIRADS 1 Last Colonoscopy: n/a  Obstetric History OB History  Gravida Para Term Preterm AB Living  2 2 2  0 0 2  SAB IAB Ectopic Multiple Live Births  0 0 0 0 2    # Outcome Date GA Lbr Len/2nd Weight Sex Type Anes PTL Lv  2 Term 04/01/04    F      1 Term 05/24/02    F Vag-Spont       Past Medical History:  Diagnosis Date   Hypertension    Thyroid disease    Vaginal Pap smear, abnormal     Past Surgical History:  Procedure Laterality Date   COLPOSCOPY W/ BIOPSY / CURETTAGE  01/24/2020       HERNIA REPAIR      Current Outpatient Medications on File Prior to Visit  Medication Sig Dispense Refill   acetaminophen (TYLENOL) 500 MG tablet Take 500-1,000 mg by mouth every 6 (six) hours as needed for headache.     amLODipine  (NORVASC ) 5 MG tablet Take 1 tablet (5 mg total) by mouth daily. 30 tablet 0   ferrous sulfate 325 (65 FE) MG tablet Take 325 mg by mouth daily with breakfast.     levothyroxine (SYNTHROID) 200 MCG tablet Take 200 mcg by mouth daily before breakfast.     lisinopril (ZESTRIL) 30 MG tablet Take 30 mg by mouth daily.     Artificial Tear Ointment (DRY EYES OP) Place 1-2 drops into both eyes 3 (three) times daily  as needed (for dry eyes). (Patient not taking: Reported on 11/23/2023)     naproxen sodium (ANAPROX) 220 MG tablet Take 220-440 mg by mouth 2 (two) times daily as needed (for pain). (Patient not taking: Reported on 11/23/2023)     No current facility-administered medications on file prior to visit.    No Known Allergies  Social History:  reports that she has never smoked. She has never used smokeless tobacco. She reports current alcohol use. She reports that she does not use drugs.  Family History  Problem Relation Age of Onset   Hypertension Mother    Breast cancer Father    Hypertension Brother     The following portions of the patient's history were reviewed and updated as appropriate: allergies, current medications, past family history, past medical history, past social history, past surgical history and problem list.  Review of Systems Pertinent items noted in HPI and remainder of comprehensive ROS otherwise negative.  Physical Exam:  BP (!) 144/83   Pulse 71   Ht 5\' 2"  (1.575 m)   Wt 156 lb 12.8 oz (71.1 kg)   LMP 11/11/2023 (Approximate) Comment: irregular spotting  BMI 28.68 kg/m  Physical Exam Vitals and nursing  note reviewed. Exam conducted with a chaperone present.  Constitutional:      Appearance: Normal appearance.  Cardiovascular:     Rate and Rhythm: Normal rate.  Pulmonary:     Effort: Pulmonary effort is normal.     Breath sounds: Normal breath sounds.  Genitourinary:    General: Normal vulva.     Exam position: Lithotomy position.     Vagina: Normal.     Cervix: Normal.     Comments: Normal appearing IUD strings, cut to the level of the external os Neurological:     General: No focal deficit present.     Mental Status: She is alert and oriented to person, place, and time.  Psychiatric:        Mood and Affect: Mood normal.        Behavior: Behavior normal.        Thought Content: Thought content normal.        Judgment: Judgment normal.      Assessment and Plan:   1. Cervical dysplasia (Primary) Reviewed that we have 1 provider that completes cryotherapy for low grade dysplasia. Will reach out to provider if treatment recommended given that colpo is low grade and HPV negative though LSIL remains persistent.   2. IUD (intrauterine device) in place Strings cut. Discussed that it may be more difficult to remove later on   Routine preventative health maintenance measures emphasized. Please refer to After Visit Summary for other counseling recommendations.   Follow-up: No follow-ups on file.      Kiki Pelton, MD Obstetrician & Gynecologist, [facility] [employer], [facility]

## 2023-11-29 ENCOUNTER — Telehealth: Payer: Self-pay | Admitting: *Deleted

## 2023-11-29 NOTE — Telephone Encounter (Signed)
 Per chart review cryo already scheduled 01/15/24 at 8:35. I called patient with Interpreter Eda Royal. I explained recommendation per Dr. Elester Grim and reviewed appointment. She states Dr. Elester Grim had already explained procedure and that we would call her if doctors felt needed. She denies any questions and voices understanding. Alejandra Hurst

## 2023-11-29 NOTE — Telephone Encounter (Signed)
-----   Message from Kiki Pelton sent at 11/27/2023 12:25 PM EDT ----- Regarding: cryo Hello,  Admin: please schedule cryo with Dr. Adriana Hopping Clinical: notify patient that Dr. Adriana Hopping thinks cryo is reasonable next step to treat low grade changes and avoid future colposcopies.  Thank you,  Ajewole

## 2023-12-27 NOTE — Result Encounter Note (Signed)
 Please let pt know: Her anemia is better then before but still present, would recommend she continue taking the Iron pill: 1 tab daily. Also recommend ensuring adequate iron containing foods in diet: cooked spinach, meat(if she is a meat eater), 10z( ounce) of nuts per day, berries. Read labels on food to see what has Iron in it. Then recheck levels in 3 months. Her Thyroid lab shows: she is taking too much: So cut back on dose:>  > current dose of Levothyroxine 200mcg is 1 tab daily except Sat and Mondays: take 2 tabs. > new dosing: 1 tab daily except on Saturdays: take 2 tabs. 4. Repeat Thyroid test in about 6 weeks. 5. Chemistry panel/metabolic panel is great. 6. See her next month for f/u on HTN.   Thanks

## 2024-01-15 ENCOUNTER — Other Ambulatory Visit: Payer: Self-pay

## 2024-01-15 ENCOUNTER — Ambulatory Visit (INDEPENDENT_AMBULATORY_CARE_PROVIDER_SITE_OTHER): Payer: Self-pay | Admitting: Family Medicine

## 2024-01-15 VITALS — BP 133/87 | HR 80 | Ht 62.0 in | Wt 154.1 lb

## 2024-01-15 DIAGNOSIS — N879 Dysplasia of cervix uteri, unspecified: Secondary | ICD-10-CM

## 2024-01-15 DIAGNOSIS — Z3202 Encounter for pregnancy test, result negative: Secondary | ICD-10-CM

## 2024-01-15 LAB — POCT PREGNANCY, URINE: Preg Test, Ur: NEGATIVE

## 2024-01-15 NOTE — Progress Notes (Signed)
    GYNECOLOGY CLINIC PROCEDURE NOTE  Cryotherapy details  Indication: CIN I pathology after colposcopy on  10/03/23 after low-grade squamous intraepithelial neoplasia (LGSIL - encompassing HPV,mild dysplasia,CIN I) pap on 01//23/2025  The indications for cryotherapy were reviewed with the patient in detail. She was counseled about that efficacy of this procedure, and possible need for excisional procedure in the future if her cervical dysplasia persists.  The risks of the procedure where explained in detail and patient was told to expect a copious amount of discharge in the next few weeks. All her questions were answered, and written informed consent was obtained.   The patient was placed in the dorsal lithotomy position and a vaginal speculum was placed. Her cervix was visualized and patient was noted to have had normal size transformation zone. The appropriate cryotherapy probe was picked and affixed to cryotherapy apparatus. Then nitrogen gas was then activated, the probe was coated with lubricating jelly and applied to the transformation zone of the cervix. This was kept in place for 3 minutes. The cryotherapy was then stopped and all instruments were removed from the patient's pelvis; a thawing period of 3 minutes was observed.  A second cycle of cryotherapy was then administered to the cervix for 3 minutes. Chaperone was present during entire procedure.  The patient tolerated the procedure well without any complications. Routine post procedure instructions were given to the patient.  Will repeat pap smear in 12 months and manage accordingly.   Dawn Nguyen 01/15/2024 11:48 AM
